# Patient Record
Sex: Female | Born: 1960 | Race: White | Hispanic: No | Marital: Married | Smoking: Current every day smoker
Health system: Southern US, Community
[De-identification: ages and names within clinical notes are randomized; demographics above are authoritative.]

## PROBLEM LIST (undated history)

## (undated) DIAGNOSIS — C801 Malignant (primary) neoplasm, unspecified: Secondary | ICD-10-CM

## (undated) HISTORY — PX: ABDOMINAL SURGERY: SHX537

---

## 2020-03-28 ENCOUNTER — Other Ambulatory Visit: Payer: Self-pay

## 2020-03-28 ENCOUNTER — Ambulatory Visit (INDEPENDENT_AMBULATORY_CARE_PROVIDER_SITE_OTHER): Payer: Self-pay

## 2020-03-28 ENCOUNTER — Encounter: Payer: Self-pay | Admitting: Emergency Medicine

## 2020-03-28 ENCOUNTER — Ambulatory Visit
Admission: EM | Admit: 2020-03-28 | Discharge: 2020-03-28 | Disposition: A | Discharge status: Home / Self Care | Payer: Self-pay | Attending: Emergency Medicine | Admitting: Emergency Medicine

## 2020-03-28 DIAGNOSIS — M25552 Pain in left hip: Secondary | ICD-10-CM

## 2020-03-28 DIAGNOSIS — S73102A Unspecified sprain of left hip, initial encounter: Secondary | ICD-10-CM

## 2020-03-28 HISTORY — DX: Malignant (primary) neoplasm, unspecified: C80.1

## 2020-03-28 MED ORDER — IBUPROFEN 600 MG PO TABS: 600.0000 mg | ORAL_TABLET | Freq: Four times a day (QID) | ORAL | 0 refills | Status: AC | PRN

## 2020-03-28 MED ORDER — TIZANIDINE HCL 4 MG PO TABS
4.0000 mg | ORAL_TABLET | Freq: Three times a day (TID) | ORAL | 0 refills | Status: DC | PRN
Start: 2020-03-28 — End: 2023-04-18

## 2020-03-28 NOTE — Discharge Instructions (Addendum)
Take 600 mg of ibuprofen combined with the 1000 mg of Tylenol 3-4 times a day as needed for pain.  You may do this for 5 to 10 days.  Zanaflex will help with any muscle spasms.  Use a cane.  See directions below.  Hold the cane on the side of your body as your unaffected (stronger) leg. Position the cane slightly to your side and a few inches forward. Move the cane forward simultaneously with your affected (weaker) leg. Plant the cane firmly on the ground before stepping forward with the stronger leg. Repeat.

## 2020-03-28 NOTE — ED Provider Notes (Signed)
HPI  SUBJECTIVE:  Tracy Leblanc is a 59 y.o. female who presents with with stabbing, sharp, constant left outer hip pain after lifting a heavy object yesterday while at work.  States that she felt and heard a "pop" immediately followed by pain.  She states that she could not bear weight on the hip immediately after this happened.  She states that the pain occasionally radiates down her leg.  She denies direct trauma, fall, bruising, erythema, swelling.  She states like she feels as if "something is moving in my hip" when she walks.  No change in her baseline chronic back pain.  She reports mild numbness and tingling in her foot.  She reports leg weakness secondary to the pain.  She tried ibuprofen 600 to 800 mg once daily to twice a day with some improvement in her symptoms.  She states that the oxymorphone that she is prescribed for sciatica 3 times daily also helps.  Symptoms are worse with walking, sitting, sleeping.  Past medical history negative for osteoporosis, left hip injury, diabetes, hypertension.  She has a history of chronic sciatica, asthma, anxiety.  She is not sure if this will be a workers comp case.  Her primary concern is whether she has broken her hip.  PMD: Maeola Sarah, MD   Past Medical History:  Diagnosis Date  . Cancer [facility])     Past Surgical History:  Procedure Laterality Date  . ABDOMINAL SURGERY      Family History  Problem Relation Age of Onset  . Dementia Mother   . Cirrhosis Father     Social History   Tobacco Use  . Smoking status: Current Every Day Smoker    Types: Cigarettes  . Smokeless tobacco: Never Used  Substance Use Topics  . Alcohol use: Yes  . Drug use: Never    No current facility-administered medications for this encounter.  Current Outpatient Medications:  .  albuterol (VENTOLIN HFA) 108 (90 Base) MCG/ACT inhaler, SMARTSIG:2 Puff(s) By Mouth Every 6 Hours PRN, Disp: , Rfl:  .  clonazePAM (KLONOPIN) 0.5 MG tablet, clonazepam 0.5 mg  tablet  TAKE 1 TABLET BY MOUTH THREE TIMES A DAY, Disp: , Rfl:  .  oxymorphone (OPANA) 10 MG tablet, oxymorphone 10 mg tablet  TAKE 1 TABLET BY MOUTH THREE TIMES A DAY, Disp: , Rfl:  .  ibuprofen (ADVIL) 600 MG tablet, Take 1 tablet (600 mg total) by mouth every 6 (six) hours as needed., Disp: 30 tablet, Rfl: 0 .  tiZANidine (ZANAFLEX) 4 MG tablet, Take 1 tablet (4 mg total) by mouth every 8 (eight) hours as needed for muscle spasms., Disp: 30 tablet, Rfl: 0  Allergies  Allergen Reactions  . Sulfa Antibiotics      ROS  As noted in HPI.   Physical Exam  BP 117/73 (BP Location: Left Arm)   Pulse 83   Temp 98.1 F (36.7 C) (Oral)   Resp 14   Ht 5' 5.5" (1.664 m)   Wt 65.8 kg   SpO2 97%   BMI 23.76 kg/m   Constitutional: Well developed, well nourished, no acute distress Eyes:  EOMI, conjunctiva normal bilaterally HENT: Normocephalic, atraumatic,mucus membranes moist Respiratory: Normal inspiratory effort Cardiovascular: Normal rate GI: nondistended skin: No rash, skin intact Musculoskeletal: No signs of trauma L hip. No bruising, erythema, rash. Positive muscular tenderness over gluteal muscles.  No tenderness down IT band, quadriceps. No pain with passive abduction/adduction of leg. No pain with int/ext rotation hip. + tenderness at sciatic  notch-  patient states that this is not new. Flexion/extension knee WNL. Knee joint NT, stable. Motor strength flexion/ext hip 5/5. Sensation to LT distally intact. DP 2+ antalgic gait but able to bear weight. Neurologic: Alert & oriented x 3, no focal neuro deficits Psychiatric: Speech and behavior appropriate   ED Course   Medications - No data to display  Orders Placed This Encounter  Procedures  . DG Hip Unilat With Pelvis 2-3 Views Left    Standing Status:   Standing    Number of Occurrences:   1    Order Specific Question:   Symptom/Reason for Exam    Answer:   Left hip pain [id number]    Order Specific Question:   Radiology  Contrast Protocol - do NOT remove file path    Answer:   [email or web address]    No results found for this or any previous visit (from the past 24 hour(s)). DG Hip Unilat With Pelvis 2-3 Views Left  Result Date: 03/28/2020 CLINICAL DATA:  Popping injury of the left hip wall lifting boxes, left hip pain over the last 36 hours. EXAM: DG HIP (WITH OR WITHOUT PELVIS) 2-3V LEFT COMPARISON:  None. FINDINGS: Mild spurring of both femoral heads. Preserved articular cartilage thickness. No findings of avulsion or fracture. Spina bifida occulta at L5. Small sclerotic lesion with central lucency in the intertrochanteric region, probably benign/incidental. IMPRESSION: 1. Mild spurring of both femoral heads, without acute bony findings. 2. Spina bifida occulta at L5. Electronically Signed   By: Van Clines M.D.   On: 03/28/2020 20:03    ED Clinical Impression  1. Sprain of left hip, initial encounter   2. Left hip pain      ED Assessment/Plan  Imaging hip to rule out fracture.  Reviewed imaging independently.  No fracture.  No acute bony findings.  No dislocation.  See radiology report for full details.  Patient with a left hip sprain.  May have torn a muscle or part of the fascia.  Will send home with ibuprofen 60 mg combined with 1000 mg Tylenol 3-4 times a day as needed for pain, Zanaflex.  She is to continue her Opana as prescribed.  Ice, cane, rest.  Follow-up with orthopedics in 4 to 7 days if not getting any better. Work note for today and tomorrow.  She is off Sunday and Monday.  Discussed limaging, MDM, treatment plan, and plan for follow-up with patient. patient agrees with plan.   Meds ordered this encounter  Medications  . ibuprofen (ADVIL) 600 MG tablet    Sig: Take 1 tablet (600 mg total) by mouth every 6 (six) hours as needed.    Dispense:  30 tablet    Refill:  0  . tiZANidine (ZANAFLEX) 4 MG tablet    Sig: Take 1 tablet (4 mg total) by  mouth every 8 (eight) hours as needed for muscle spasms.    Dispense:  30 tablet    Refill:  0    *This clinic note was created using [employer]. Therefore, there may be occasional mistakes despite careful proofreading.   ?    Melynda Ripple, MD 03/29/20 [phone]

## 2020-03-28 NOTE — ED Triage Notes (Signed)
Patient c/o left hip pain that started Thursday morning.  Patient states that she was lifting boxes at work and felt a pop in her left hip.

## 2020-12-20 IMAGING — CR DG HIP (WITH OR WITHOUT PELVIS) 2-3V*L*
3 series · 3 of 3 positions shown · non-contrast
Comparison: None.

CLINICAL DATA: Popping injury of the left hip wall lifting boxes,
left hip pain over the last 36 hours.

EXAM:
DG HIP (WITH OR WITHOUT PELVIS) 2-3V LEFT

[pelvis ap]
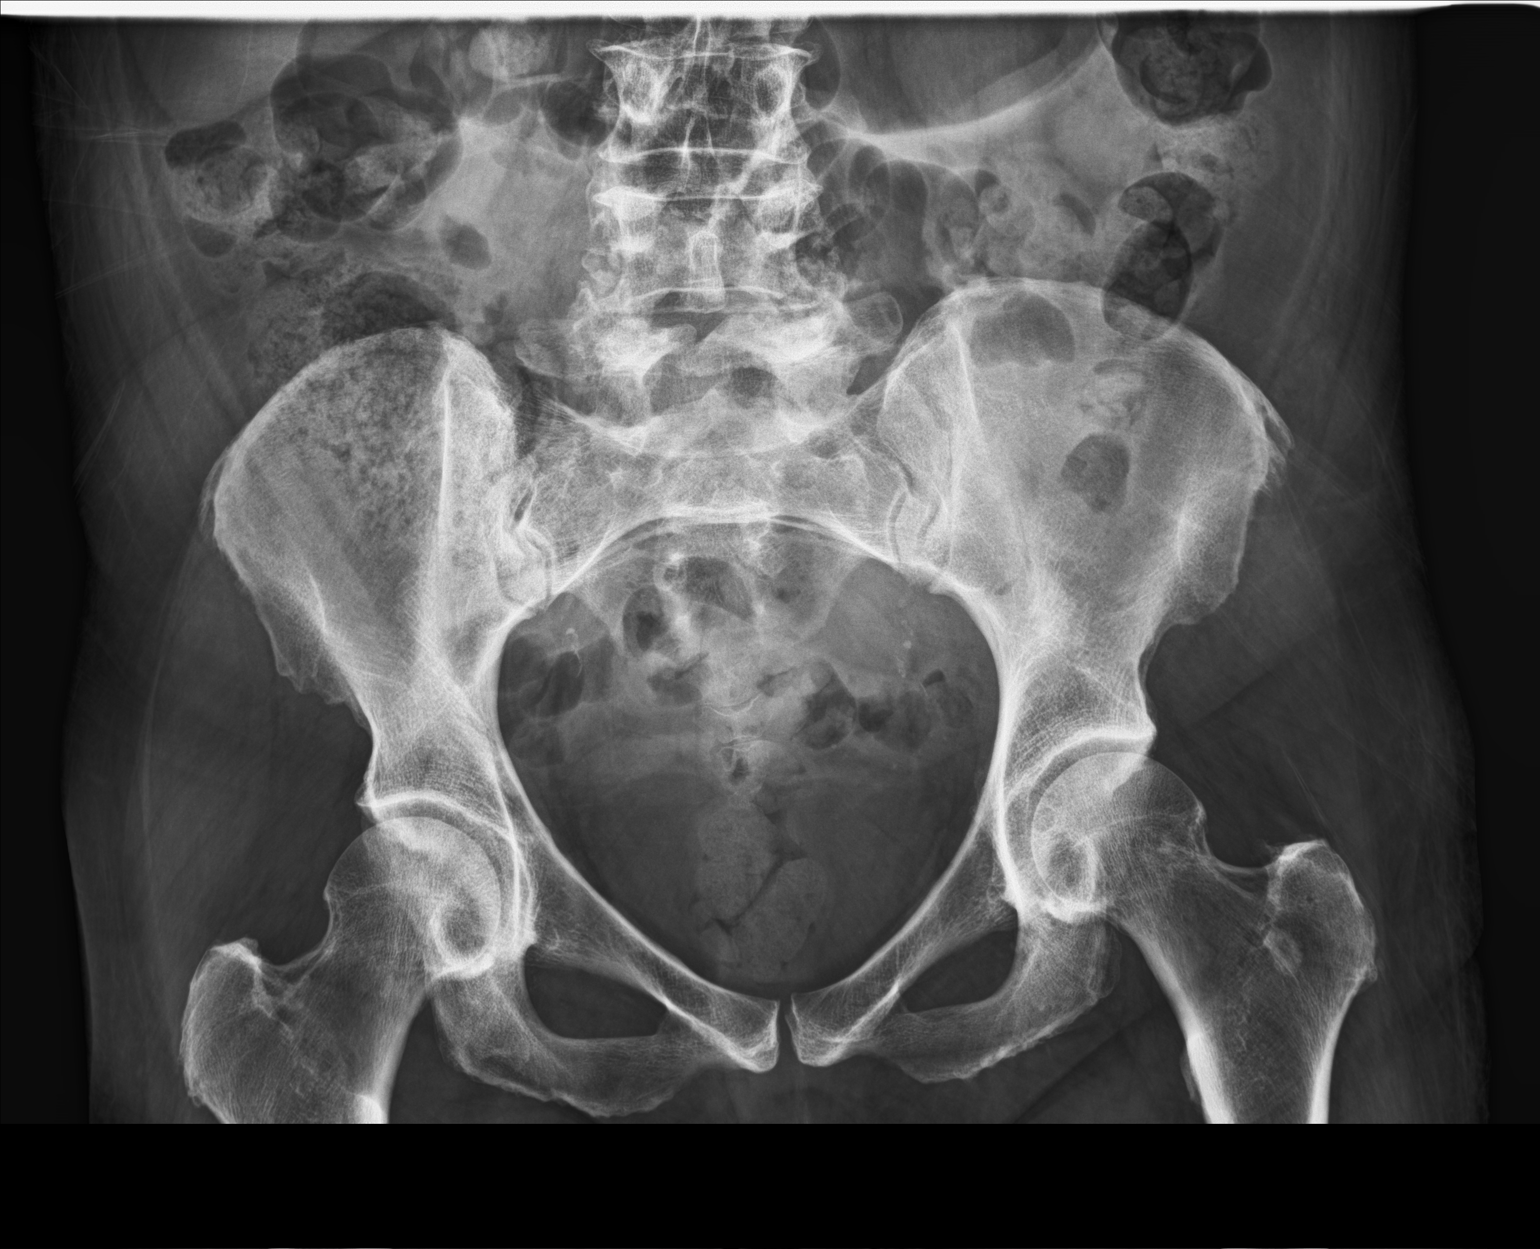

[hip ap]
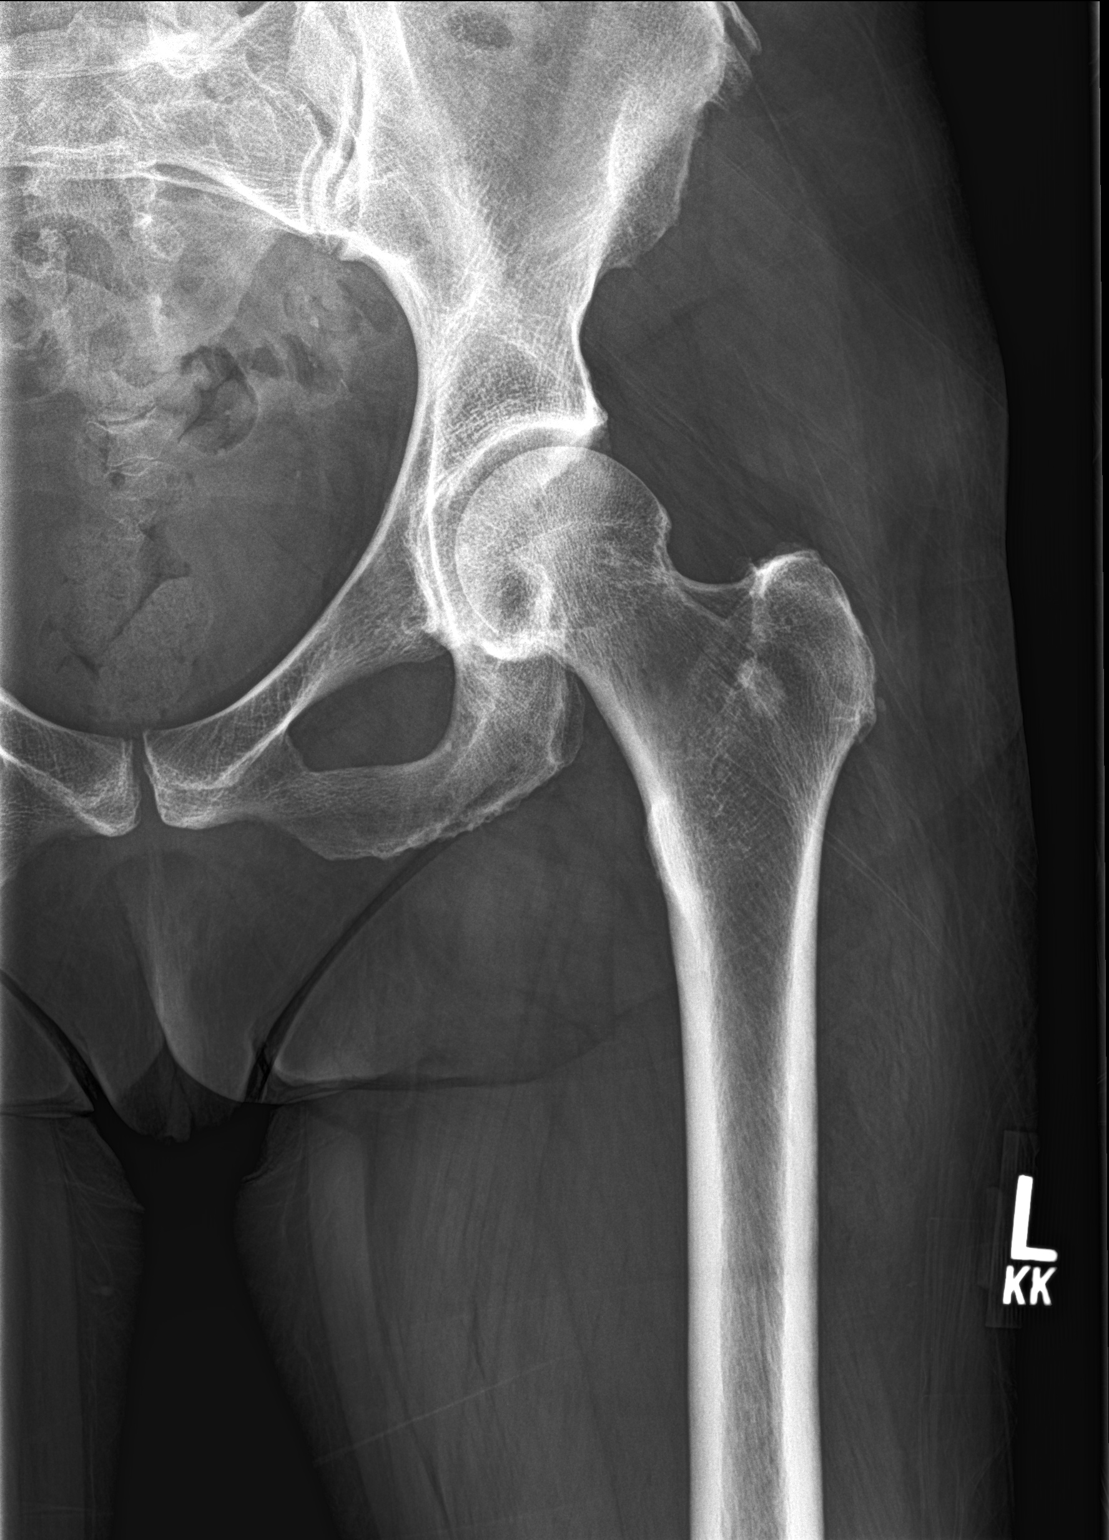

[hip lat]
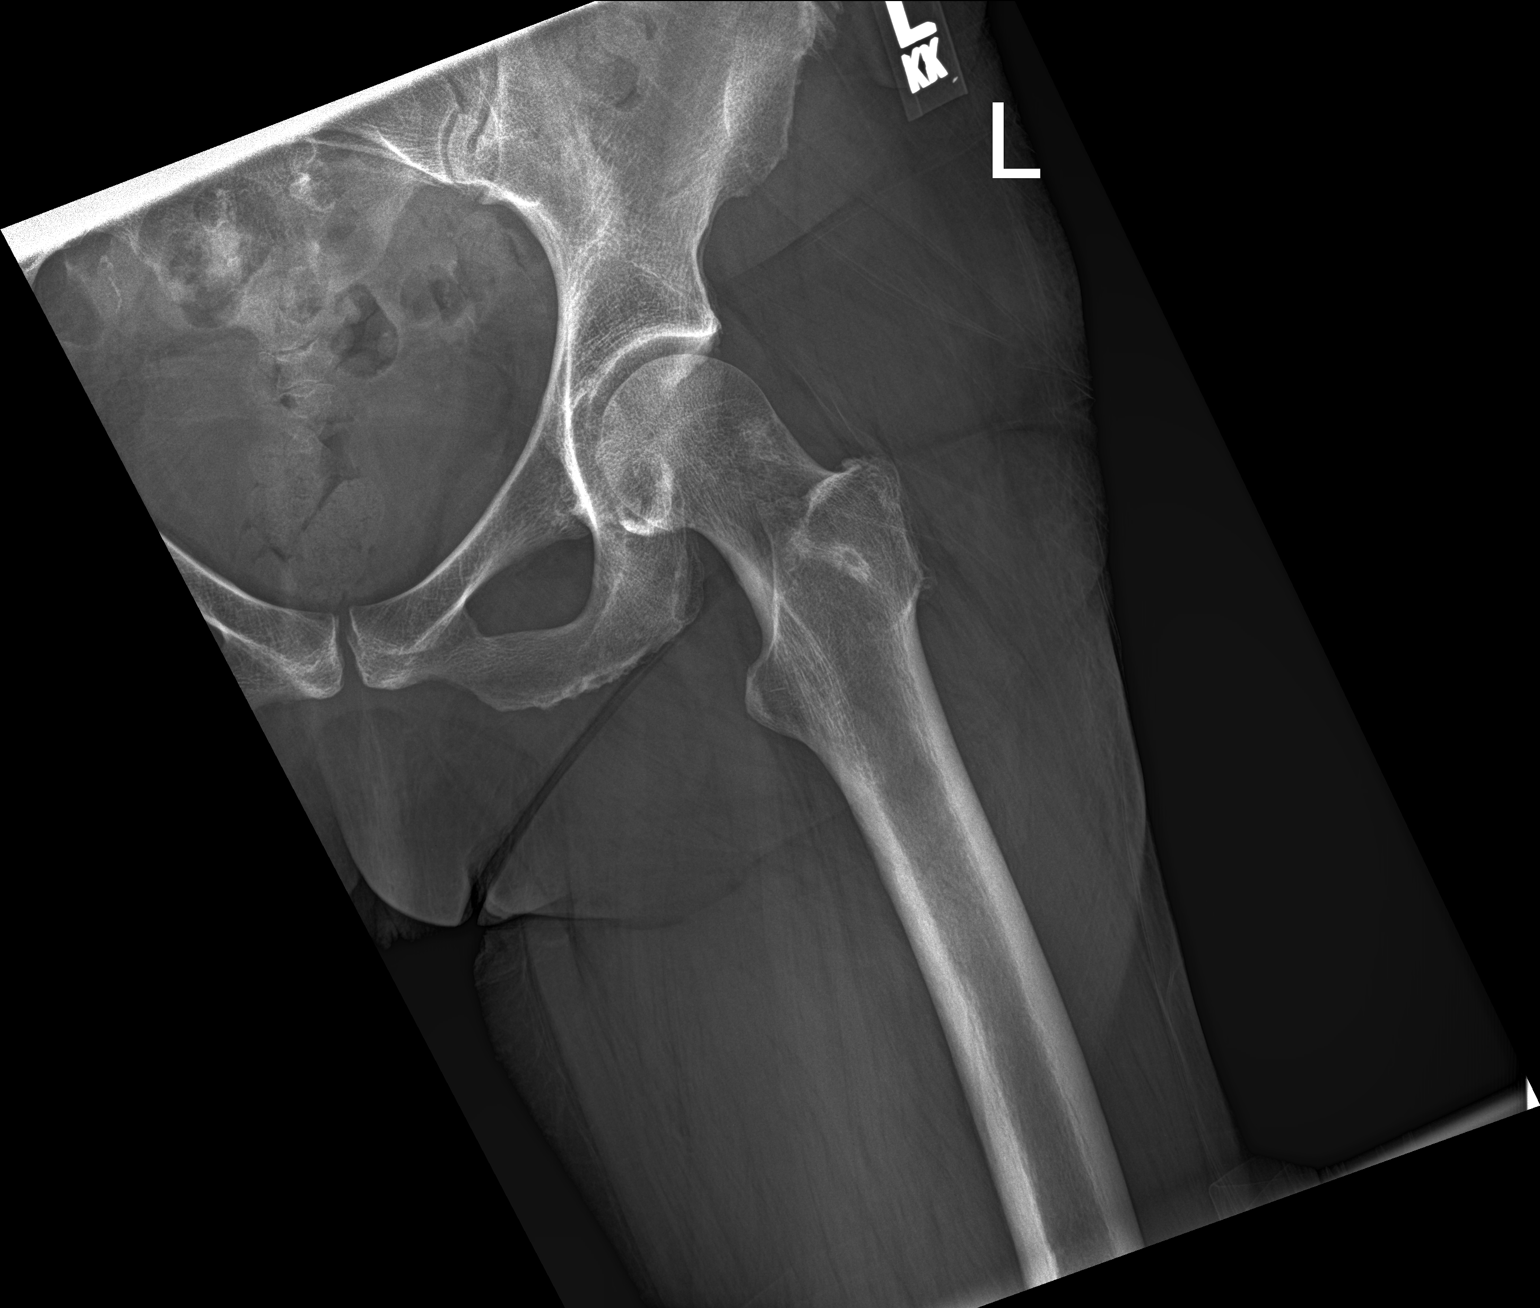

[3 of 3 positions shown; findings below may reference images not displayed]

FINDINGS: Mild spurring of both femoral heads. Preserved articular cartilage
thickness. No findings of avulsion or fracture. Spina bifida occulta
at L5. Small sclerotic lesion with central lucency in the
intertrochanteric region, probably benign/incidental.
IMPRESSION: 1. Mild spurring of both femoral heads, without acute bony findings.
2. Spina bifida occulta at L5.

## 2023-04-18 ENCOUNTER — Ambulatory Visit (INDEPENDENT_AMBULATORY_CARE_PROVIDER_SITE_OTHER): Payer: Self-pay

## 2023-04-18 ENCOUNTER — Ambulatory Visit
Admission: EM | Admit: 2023-04-18 | Discharge: 2023-04-18 | Disposition: A | Discharge status: Home / Self Care | Payer: Self-pay

## 2023-04-18 DIAGNOSIS — J209 Acute bronchitis, unspecified: Secondary | ICD-10-CM

## 2023-04-18 MED ORDER — DOXYCYCLINE MONOHYDRATE 100 MG PO TABS: 100.0000 mg | ORAL_TABLET | Freq: Two times a day (BID) | ORAL | 0 refills | Status: AC

## 2023-04-18 MED ORDER — ALBUTEROL SULFATE HFA 108 (90 BASE) MCG/ACT IN AERS: 2.0000 | INHALATION_SPRAY | RESPIRATORY_TRACT | 0 refills | Status: AC | PRN

## 2023-04-18 MED ORDER — PREDNISONE 10 MG (21) PO TBPK: ORAL_TABLET | ORAL | 0 refills | Status: AC

## 2023-04-18 MED ORDER — IPRATROPIUM-ALBUTEROL 0.5-2.5 (3) MG/3ML IN SOLN
3.0000 mL | Freq: Once | RESPIRATORY_TRACT | Status: AC
  Administered 2023-04-18: 3 mL via RESPIRATORY_TRACT

## 2023-04-18 NOTE — Discharge Instructions (Signed)
Work on stop smoking

## 2023-04-18 NOTE — ED Triage Notes (Signed)
Pt c/o cough, congestion, SOB x1 week. Pt reports productive cough. Pt has not been taking anything for sxs.

## 2023-04-18 NOTE — ED Provider Notes (Signed)
[facility]    CSN: [id number] Arrival date & time: 04/18/23  1444      History   Chief Complaint Chief Complaint  Patient presents with   Cough   Nasal Congestion    HPI Tracy Leblanc is a 62 y.o. female who presents with onset of URI one week ago and had a fever up to 101 2 days ago, none since. Her cough has been getting worse and has been wheezing. Feels SOB. She is a smoker of 1.5 ppd. Does not use any inhalers.    Past Medical History:  Diagnosis Date   Cancer (HCC)     There are no problems to display for this patient.   Past Surgical History:  Procedure Laterality Date   ABDOMINAL SURGERY      OB History   No obstetric history on file.      Home Medications    Prior to Admission medications   Medication Sig Start Date End Date Taking? Authorizing Provider  albuterol (VENTOLIN HFA) 108 (90 Base) MCG/ACT inhaler Inhale 2 puffs into the lungs every 4 (four) hours as needed for wheezing or shortness of breath. 04/18/23  Yes Rodriguez-Southworth, Nettie Elm, PA-C  amphetamine-dextroamphetamine (ADDERALL) 10 MG tablet dextroamphetamine-amphetamine 10 mg tablet  Take 1 tablet twice a day by oral route for 30 days.   Yes [provider]  amphetamine-dextroamphetamine (ADDERALL) 20 MG tablet TAKE 1 TABLET BY MOUTH EVERY MORNING AND TAKE 1/2 TAB EVERY AFTERNOON 10/11/22  Yes [provider]  clonazePAM (KLONOPIN) 0.5 MG tablet clonazepam 0.5 mg tablet  TAKE 1 TABLET BY MOUTH THREE TIMES A DAY   Yes [provider]  doxycycline (ADOXA) 100 MG tablet Take 1 tablet (100 mg total) by mouth 2 (two) times daily. 04/18/23  Yes Rodriguez-Southworth, Nettie Elm, PA-C  Oxycodone HCl 10 MG TABS TAKE 1 TABLET BY MOUTH FOUR TIMES A DAY AS NEEDED FOR 30 DAYS.04/02 10/11/22  Yes [provider]  predniSONE (STERAPRED UNI-PAK 21 TAB) 10 MG (21) TBPK tablet Take as directed 04/18/23  Yes Rodriguez-Southworth, Nettie Elm, PA-C  ibuprofen (ADVIL) 600  MG tablet Take 1 tablet (600 mg total) by mouth every 6 (six) hours as needed. 03/28/20   Domenick Gong, MD    Family History Family History  Problem Relation Age of Onset   Dementia Mother    Cirrhosis Father     Social History Social History   Tobacco Use   Smoking status: Every Day    Types: Cigarettes   Smokeless tobacco: Never  Vaping Use   Vaping Use: Never used  Substance Use Topics   Alcohol use: Yes   Drug use: Never     Allergies   Sulfa antibiotics   Review of Systems Review of Systems As noted in HPI  Physical Exam Triage Vital Signs ED Triage Vitals  Enc Vitals Group     BP 04/18/23 1720 117/72     Pulse Rate 04/18/23 1720 92     Resp --      Temp 04/18/23 1720 98.2 F (36.8 C)     Temp Source 04/18/23 1720 Oral     SpO2 04/18/23 1720 94 %     Weight 04/18/23 1718 125 lb (56.7 kg)     Height 04/18/23 1718 5' 5.5" (1.664 m)     Head Circumference --      Peak Flow --      Pain Score 04/18/23 1718 0     Pain Loc --  Pain Edu? --      Excl. in GC? --    No data found.  Updated Vital Signs BP 117/72 (BP Location: Right Arm)   Pulse 92   Temp 98.2 F (36.8 C) (Oral)   Ht 5' 5.5" (1.664 m)   Wt 125 lb (56.7 kg)   SpO2 94%   BMI 20.48 kg/m   Visual Acuity Right Eye Distance:   Left Eye Distance:   Bilateral Distance:    Right Eye Near:   Left Eye Near:    Bilateral Near:     Physical Exam  Physical Exam Repeated pulse ox 97% Constitutional:      General: He is not in acute distress.    Appearance: He is not toxic-appearing.  HENT:     Head: Normocephalic.     Right Ear: Tympanic membrane, ear canal and external ear normal.     Left Ear: Ear canal and external ear normal.     Nose: Nose normal.     Mouth/Throat:     Mouth: Mucous membranes are moist.     Pharynx: Oropharynx is clear.  Eyes:     General: No scleral icterus.    Conjunctiva/sclera: Conjunctivae normal.  Cardiovascular:     Rate and Rhythm: Normal  rate and regular rhythm.     Heart sounds: No murmur heard.   Pulmonary:     Effort: Pulmonary effort is poor. No respiratory distress.     Breath sounds: Wheezing present on bases. Has decreased breath sounds on upper lungs.      Comments: Has auditory wheezing Musculoskeletal:        General: Normal range of motion.     Cervical back: Neck supple.  Lymphadenopathy:     Cervical: No cervical adenopathy.  Skin:    General: Skin is warm and dry.     Findings: No rash.  Neurological:     Mental Status: He is alert and oriented to person, place, and time.     Gait: Gait normal.  Psychiatric:        Mood and Affect: Mood normal.        Behavior: Behavior normal.        Thought Content: Thought content normal.        Judgment: Judgment normal.   UC Treatments / Results  Labs (all labs ordered are listed, but only abnormal results are displayed) Labs Reviewed - No data to display  EKG   Radiology DG Chest 2 View  Result Date: 04/18/2023 CLINICAL DATA:  Shortness of breath for a week.  Cough EXAM: CHEST - 2 VIEW COMPARISON:  None Available. FINDINGS: Hyperinflation. No consolidation, pneumothorax or effusion. No edema. Normal cardiopericardial silhouette. Degenerative changes along the diffusely. IMPRESSION: Hyperinflation.  No acute cardiopulmonary disease Electronically Signed   By: Karen Kays M.D.   On: 04/18/2023 17:43    Procedures Procedures (including critical care time)  Medications Ordered in UC Medications  ipratropium-albuterol (DUONEB) 0.5-2.5 (3) MG/3ML nebulizer solution 3 mL (3 mLs Nebulization Given 04/18/23 1757)    Initial Impression / Assessment and Plan / UC Course  I have reviewed the triage vital signs and the nursing notes. She was given Duo neb treatment and wheezing was slightly improved after and pulse ox increased to 97% Pertinent  imaging results that were available during my care of the patient were reviewed by me and considered in my medical  decision making (see chart for details).  Acute bronchitis  I placed her on  Doxy, prednisone, and Albuterol inhaler as noted. Advised to d/c smoking   Final Clinical Impressions(s) / UC Diagnoses   Final diagnoses:  Acute bronchitis, unspecified organism     Discharge Instructions      Work on stop smoking      ED Prescriptions     Medication Sig Dispense Auth. Provider   predniSONE (STERAPRED UNI-PAK 21 TAB) 10 MG (21) TBPK tablet Take as directed 21 tablet Rodriguez-Southworth, Nettie Elm, PA-C   doxycycline (ADOXA) 100 MG tablet Take 1 tablet (100 mg total) by mouth 2 (two) times daily. 20 tablet Rodriguez-Southworth, Nettie Elm, PA-C   albuterol (VENTOLIN HFA) 108 (90 Base) MCG/ACT inhaler Inhale 2 puffs into the lungs every 4 (four) hours as needed for wheezing or shortness of breath. 18 g Rodriguez-Southworth, Nettie Elm, PA-C      PDMP not reviewed this encounter.   Garey Ham, [place] 04/18/23 1821
